# Patient Record
Sex: Female | Born: 2000 | Race: White | Hispanic: No | Marital: Single | Smoking: Never smoker
Health system: Southern US, Community
[De-identification: ages and names within clinical notes are randomized; demographics above are authoritative.]

## PROBLEM LIST (undated history)

## (undated) DIAGNOSIS — M1990 Unspecified osteoarthritis, unspecified site: Secondary | ICD-10-CM

## (undated) DIAGNOSIS — Q6589 Other specified congenital deformities of hip: Secondary | ICD-10-CM

## (undated) DIAGNOSIS — J45909 Unspecified asthma, uncomplicated: Secondary | ICD-10-CM

## (undated) HISTORY — DX: Unspecified asthma, uncomplicated: J45.909

---
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
## 2020-07-10 DIAGNOSIS — B373 Candidiasis of vulva and vagina: Secondary | ICD-10-CM | POA: Diagnosis not present

## 2020-08-18 DIAGNOSIS — Z20822 Contact with and (suspected) exposure to covid-19: Secondary | ICD-10-CM | POA: Diagnosis not present

## 2020-08-31 DIAGNOSIS — Z20822 Contact with and (suspected) exposure to covid-19: Secondary | ICD-10-CM | POA: Diagnosis not present

## 2020-09-28 DIAGNOSIS — J029 Acute pharyngitis, unspecified: Secondary | ICD-10-CM | POA: Diagnosis not present

## 2020-09-28 DIAGNOSIS — R0981 Nasal congestion: Secondary | ICD-10-CM | POA: Diagnosis not present
